# Patient Record
Sex: Female | Born: 1965 | Race: White | Hispanic: No | Marital: Single | Smoking: Never smoker
Health system: Southern US, Community
[De-identification: ages and names within clinical notes are randomized; demographics above are authoritative.]

## PROBLEM LIST (undated history)

## (undated) DIAGNOSIS — R7303 Prediabetes: Secondary | ICD-10-CM

## (undated) DIAGNOSIS — H00019 Hordeolum externum unspecified eye, unspecified eyelid: Secondary | ICD-10-CM

## (undated) DIAGNOSIS — I10 Essential (primary) hypertension: Secondary | ICD-10-CM

## (undated) DIAGNOSIS — B029 Zoster without complications: Secondary | ICD-10-CM

## (undated) DIAGNOSIS — E7800 Pure hypercholesterolemia, unspecified: Secondary | ICD-10-CM

## (undated) HISTORY — DX: Zoster without complications: B02.9

## (undated) HISTORY — PX: NO PAST SURGERIES: SHX2092

## (undated) HISTORY — DX: Hordeolum externum unspecified eye, unspecified eyelid: H00.019

---

## 1998-02-13 ENCOUNTER — Other Ambulatory Visit: Admission: RE | Admit: 1998-02-13 | Discharge: 1998-02-13 | Payer: Self-pay | Admitting: *Deleted

## 1999-02-16 ENCOUNTER — Other Ambulatory Visit: Admission: RE | Admit: 1999-02-16 | Discharge: 1999-02-16 | Payer: Self-pay | Admitting: *Deleted

## 1999-06-14 ENCOUNTER — Encounter (INDEPENDENT_AMBULATORY_CARE_PROVIDER_SITE_OTHER): Payer: Self-pay | Admitting: Specialist

## 1999-06-14 ENCOUNTER — Ambulatory Visit (HOSPITAL_COMMUNITY): Admission: AD | Admit: 1999-06-14 | Discharge: 1999-06-14 | Payer: Self-pay | Admitting: Obstetrics and Gynecology

## 1999-10-27 ENCOUNTER — Other Ambulatory Visit: Admission: RE | Admit: 1999-10-27 | Discharge: 1999-10-27 | Payer: Self-pay | Admitting: *Deleted

## 2000-04-20 ENCOUNTER — Inpatient Hospital Stay (HOSPITAL_COMMUNITY): Admission: AD | Admit: 2000-04-20 | Discharge: 2000-04-22 | Payer: Self-pay | Admitting: *Deleted

## 2000-04-22 ENCOUNTER — Encounter: Payer: Self-pay | Admitting: *Deleted

## 2000-05-03 ENCOUNTER — Inpatient Hospital Stay (HOSPITAL_COMMUNITY): Admission: AD | Admit: 2000-05-03 | Discharge: 2000-05-07 | Payer: Self-pay | Admitting: Obstetrics and Gynecology

## 2000-05-08 ENCOUNTER — Encounter: Admission: RE | Admit: 2000-05-08 | Discharge: 2000-08-06 | Payer: Self-pay | Admitting: Obstetrics and Gynecology

## 2000-06-21 ENCOUNTER — Other Ambulatory Visit: Admission: RE | Admit: 2000-06-21 | Discharge: 2000-06-21 | Payer: Self-pay | Admitting: *Deleted

## 2001-06-28 ENCOUNTER — Other Ambulatory Visit: Admission: RE | Admit: 2001-06-28 | Discharge: 2001-06-28 | Payer: Self-pay | Admitting: *Deleted

## 2002-10-26 ENCOUNTER — Other Ambulatory Visit: Admission: RE | Admit: 2002-10-26 | Discharge: 2002-10-26 | Payer: Self-pay | Admitting: Obstetrics and Gynecology

## 2004-01-29 ENCOUNTER — Other Ambulatory Visit: Admission: RE | Admit: 2004-01-29 | Discharge: 2004-01-29 | Payer: Self-pay | Admitting: Obstetrics and Gynecology

## 2009-01-01 ENCOUNTER — Encounter: Admission: RE | Admit: 2009-01-01 | Discharge: 2009-01-01 | Payer: Self-pay | Admitting: Internal Medicine

## 2009-07-23 LAB — HM MAMMOGRAPHY: HM Mammogram: NORMAL

## 2009-07-24 LAB — CONVERTED CEMR LAB: Pap Smear: NORMAL

## 2009-10-24 IMAGING — CR DG FOOT COMPLETE 3+V*L*
3 series · 3 of 3 positions shown · non-contrast
Comparison: None.

CLINICAL DATA: Injury 1 week ago.

LEFT FOOT - COMPLETE 3+ VIEW

[view not recorded (1 of 3)]
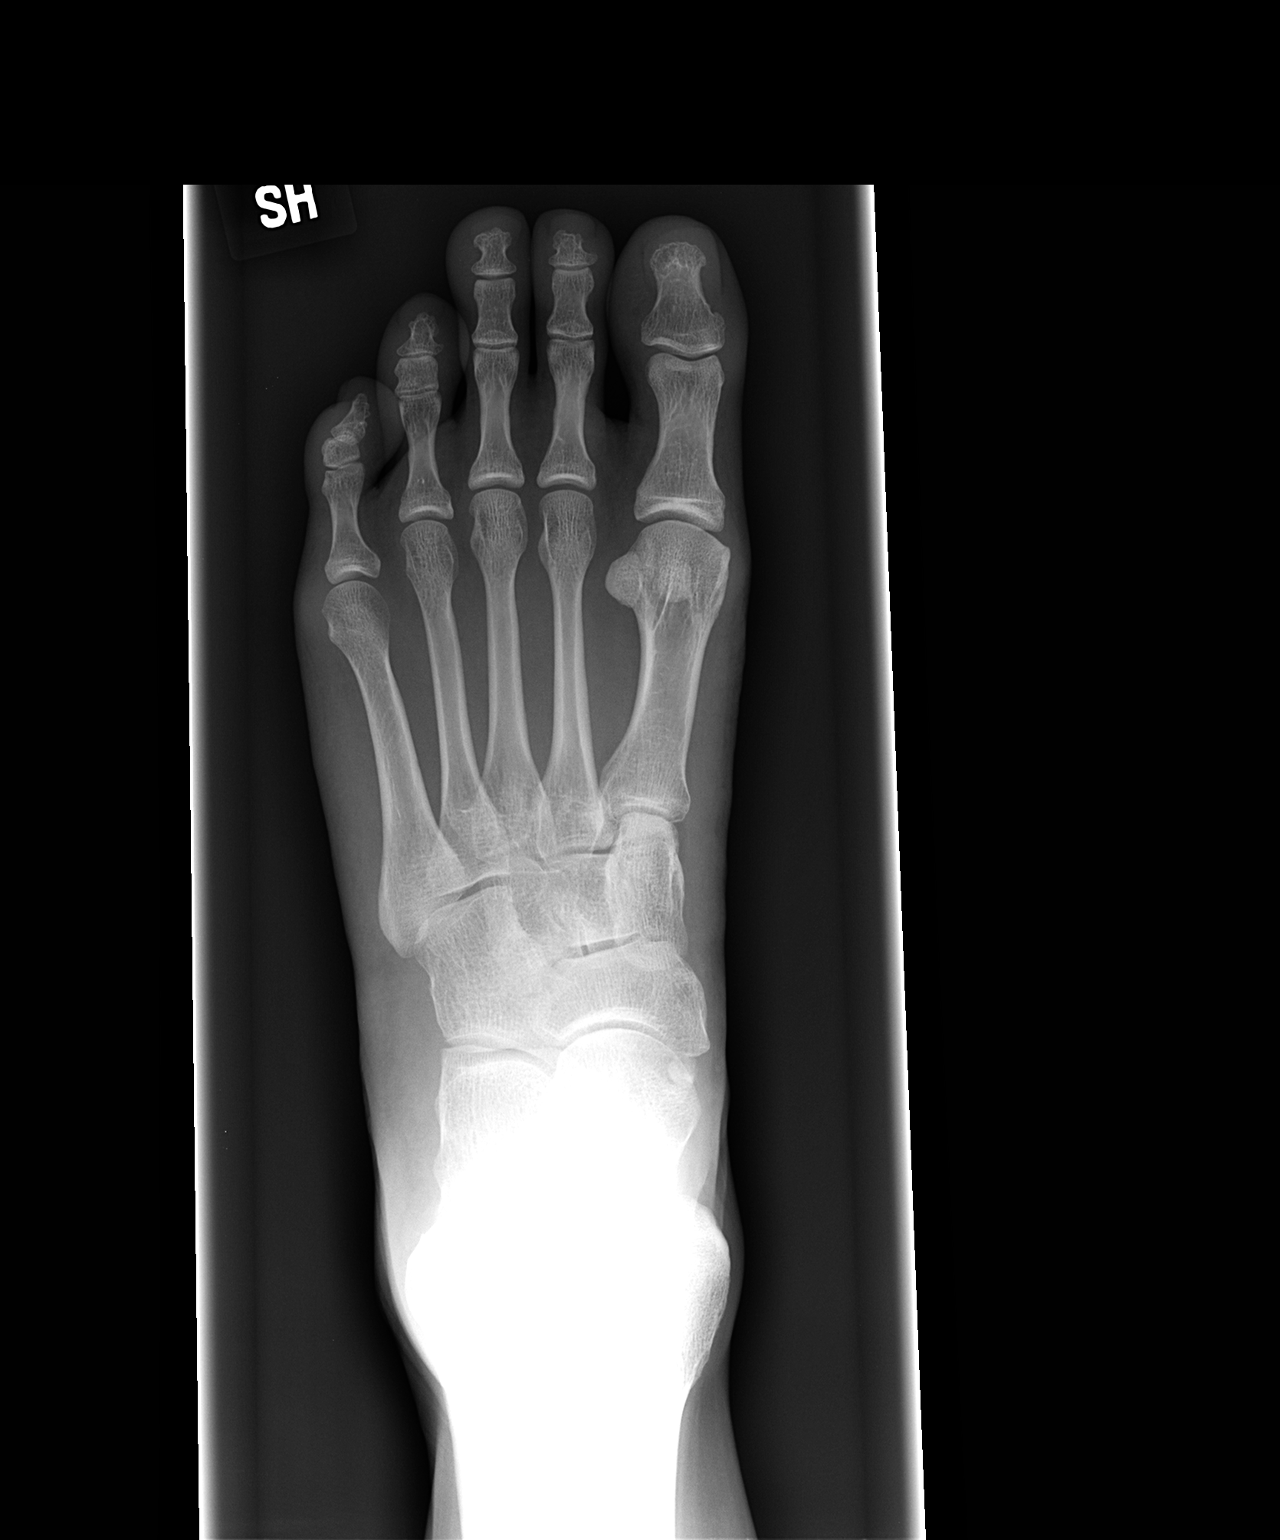

[view not recorded (2 of 3)]
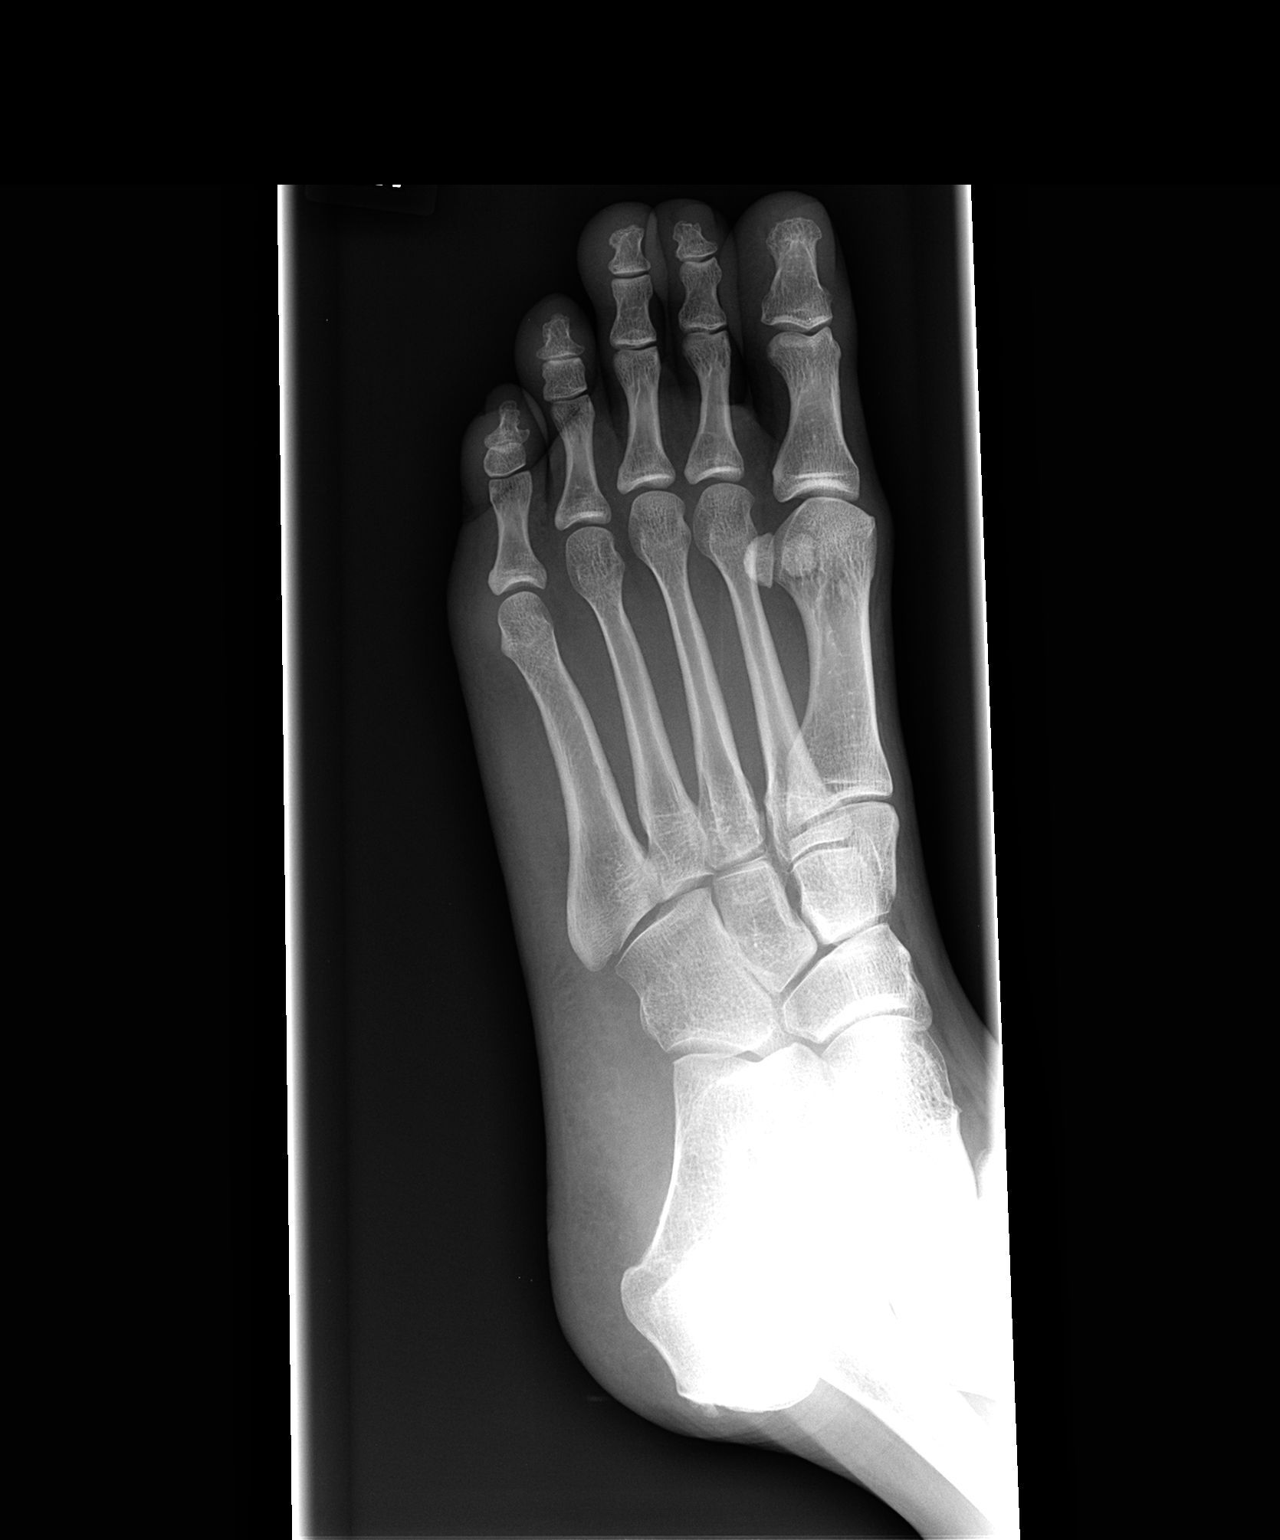

[view not recorded (3 of 3)]
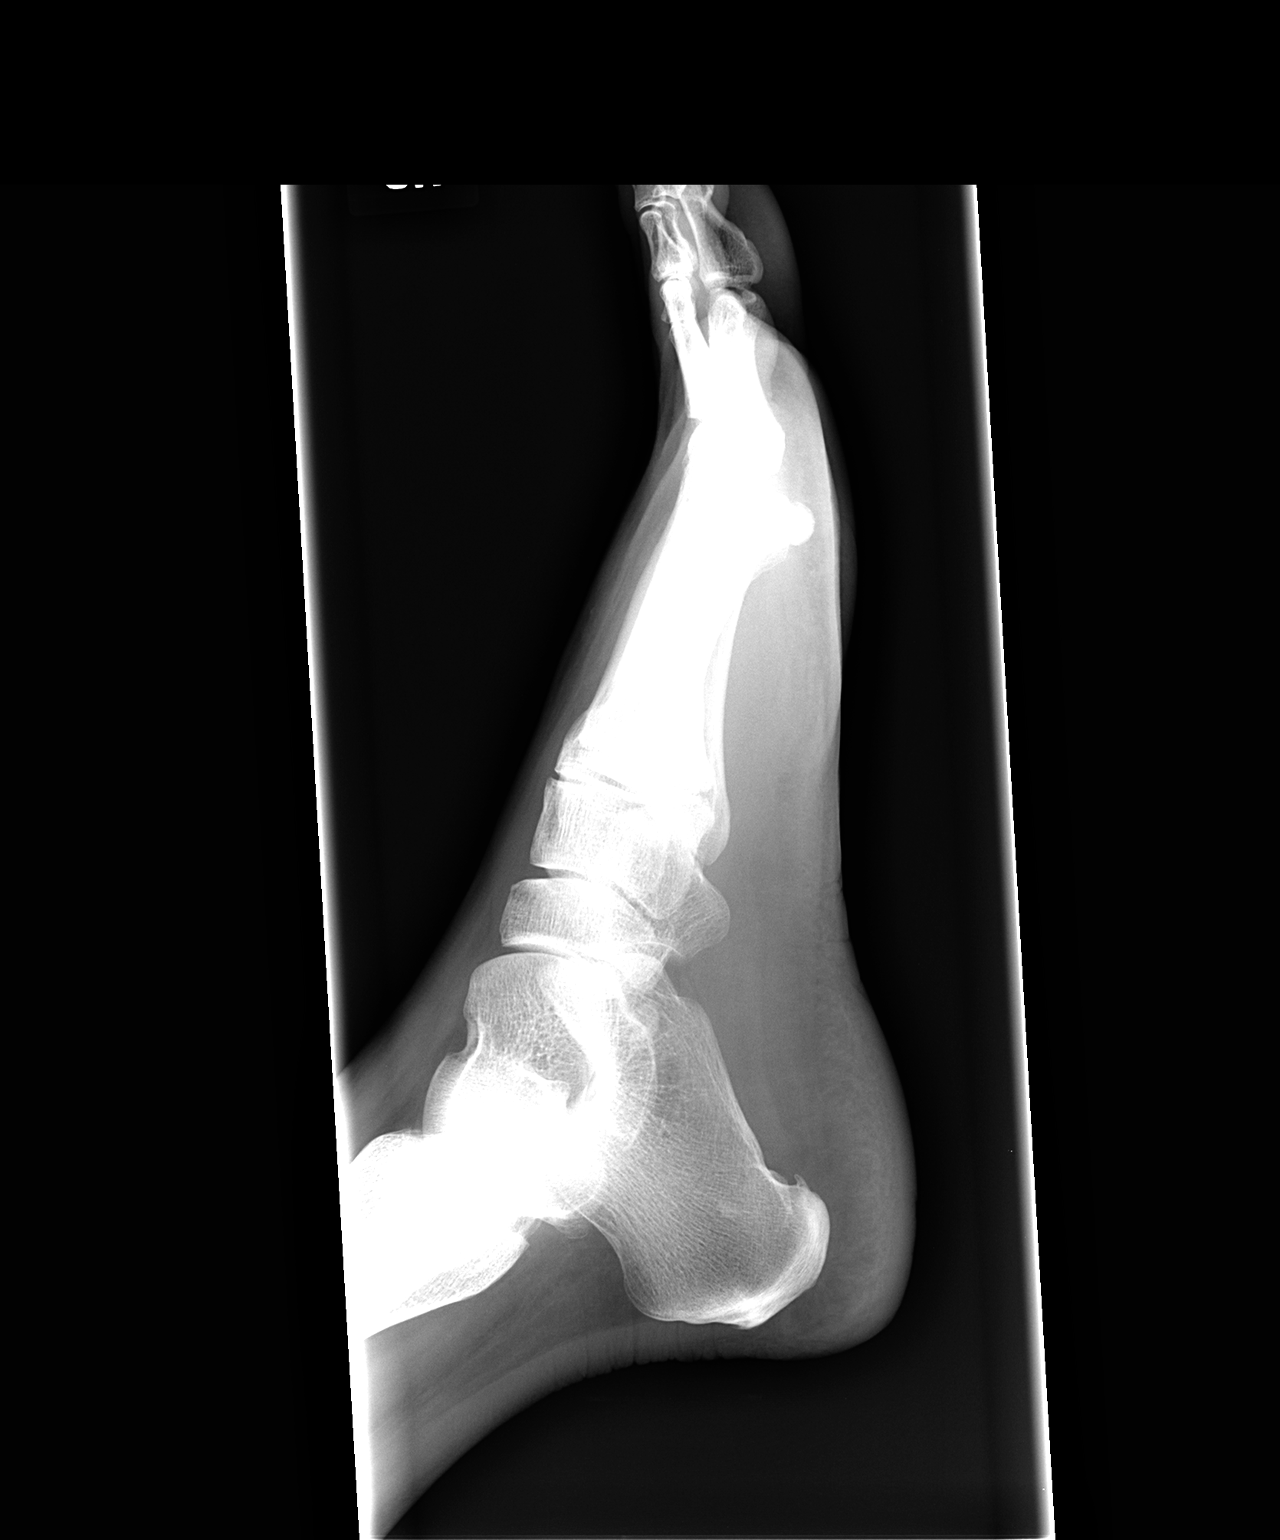

[3 of 3 positions shown; findings below may reference images not displayed]

FINDINGS: Negative for fracture.  There is no arthropathy and the
alignment is normal.  There is early spurring of the calcaneus.
IMPRESSION: Negative for fracture.

## 2009-10-24 IMAGING — CR DG ANKLE COMPLETE 3+V*L*
3 series · 3 of 3 positions shown · non-contrast
Comparison: None

CLINICAL DATA: Rolled foot a week ago with some pain laterally

LEFT ANKLE COMPLETE - 3+ VIEW

[view not recorded (1 of 3)]
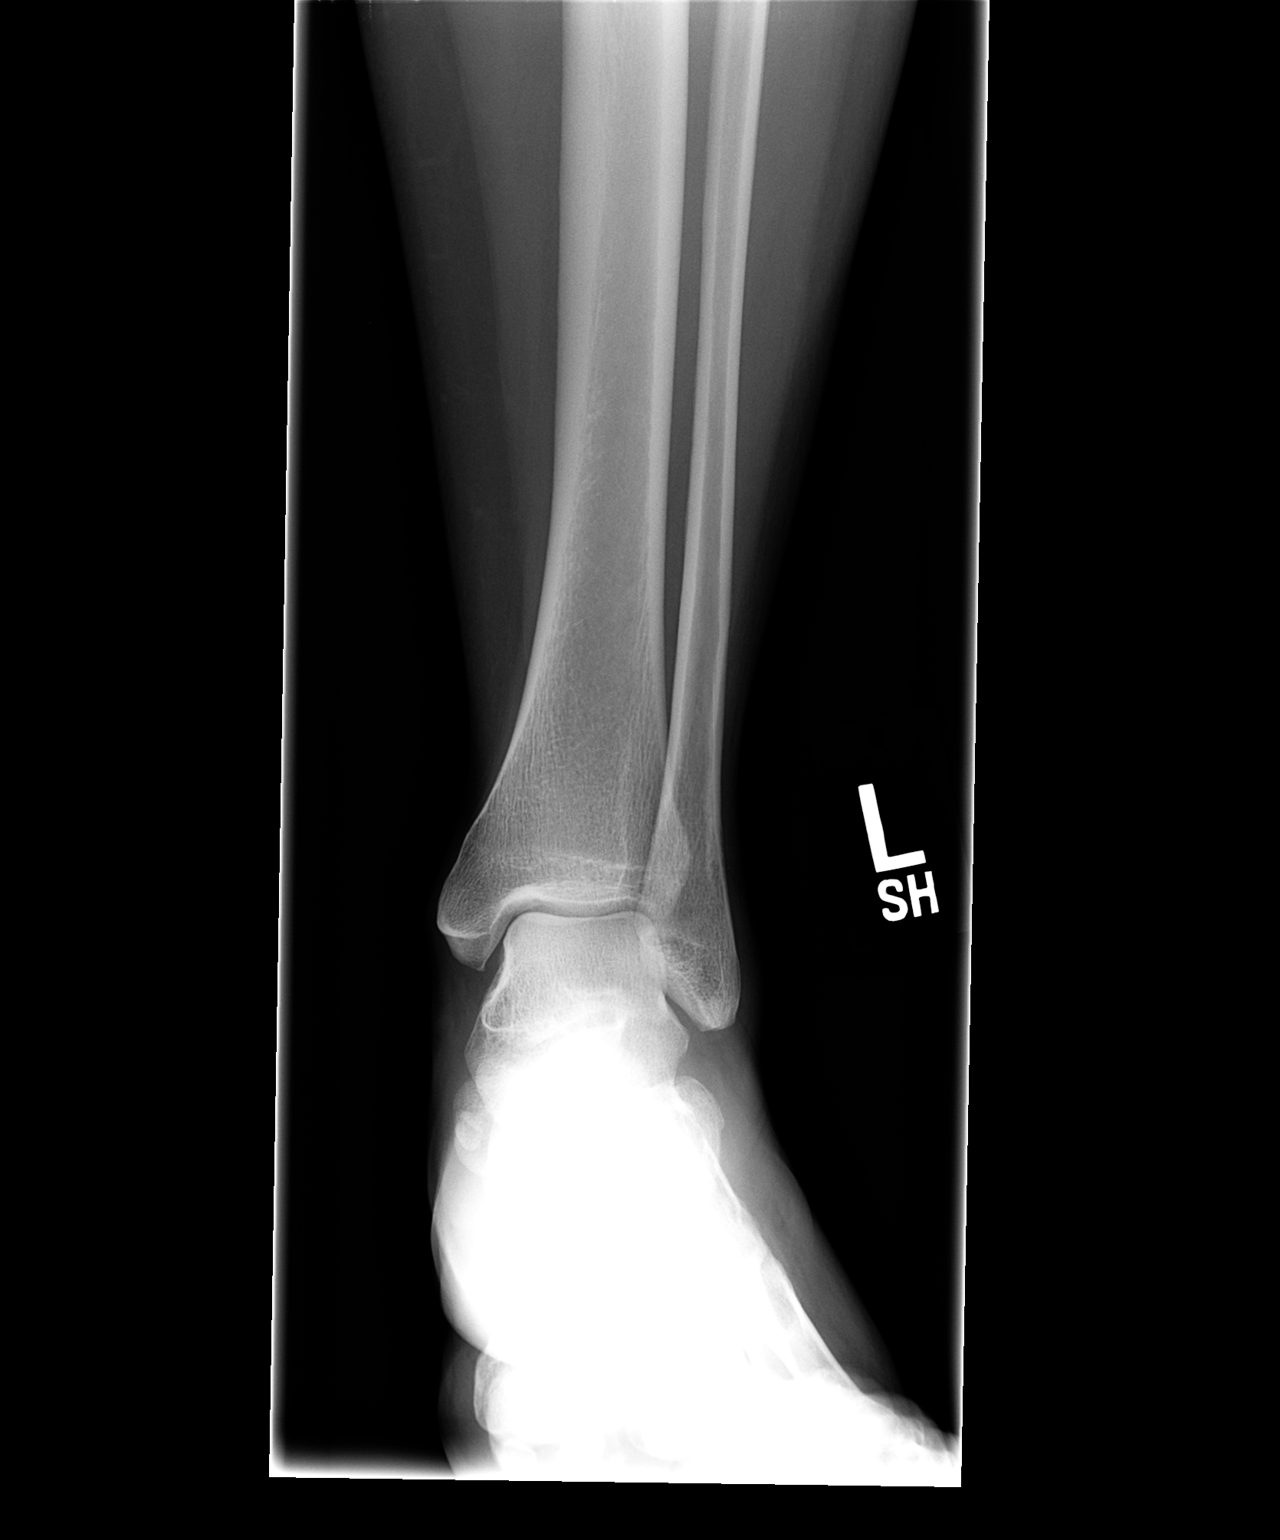

[view not recorded (2 of 3)]
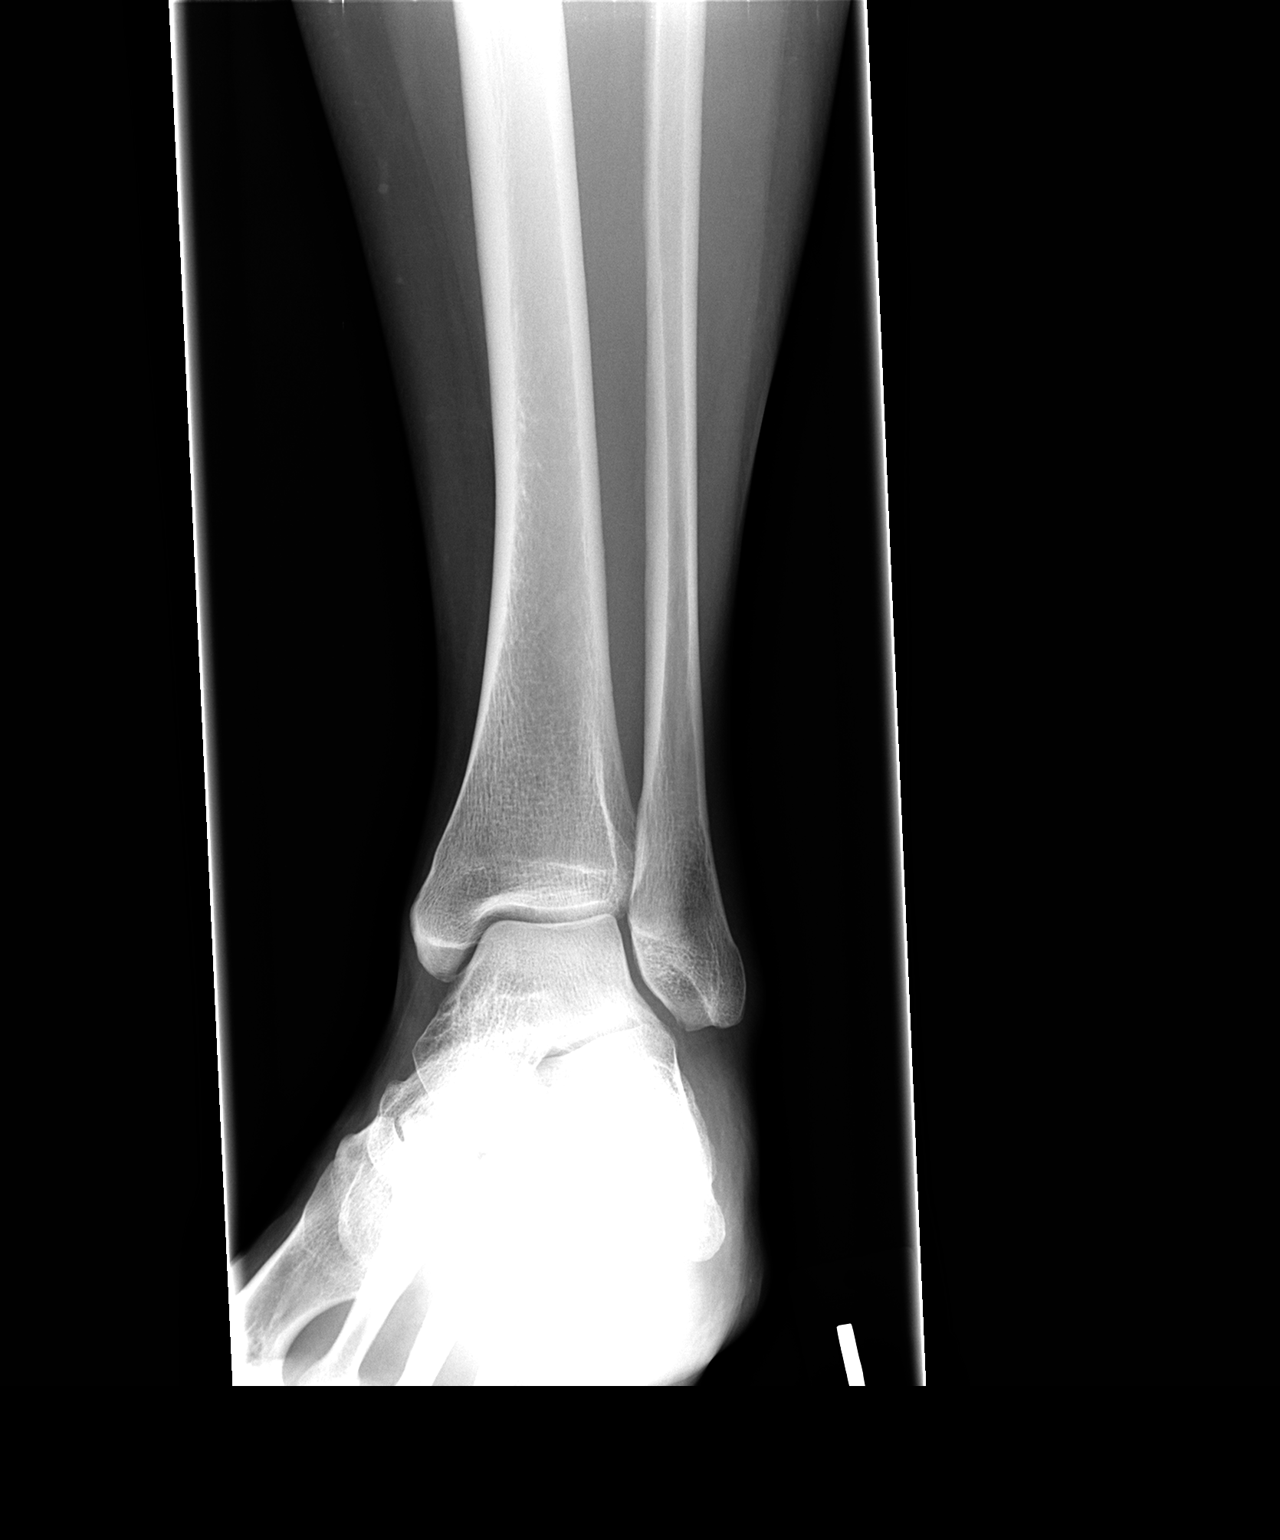

[view not recorded (3 of 3)]
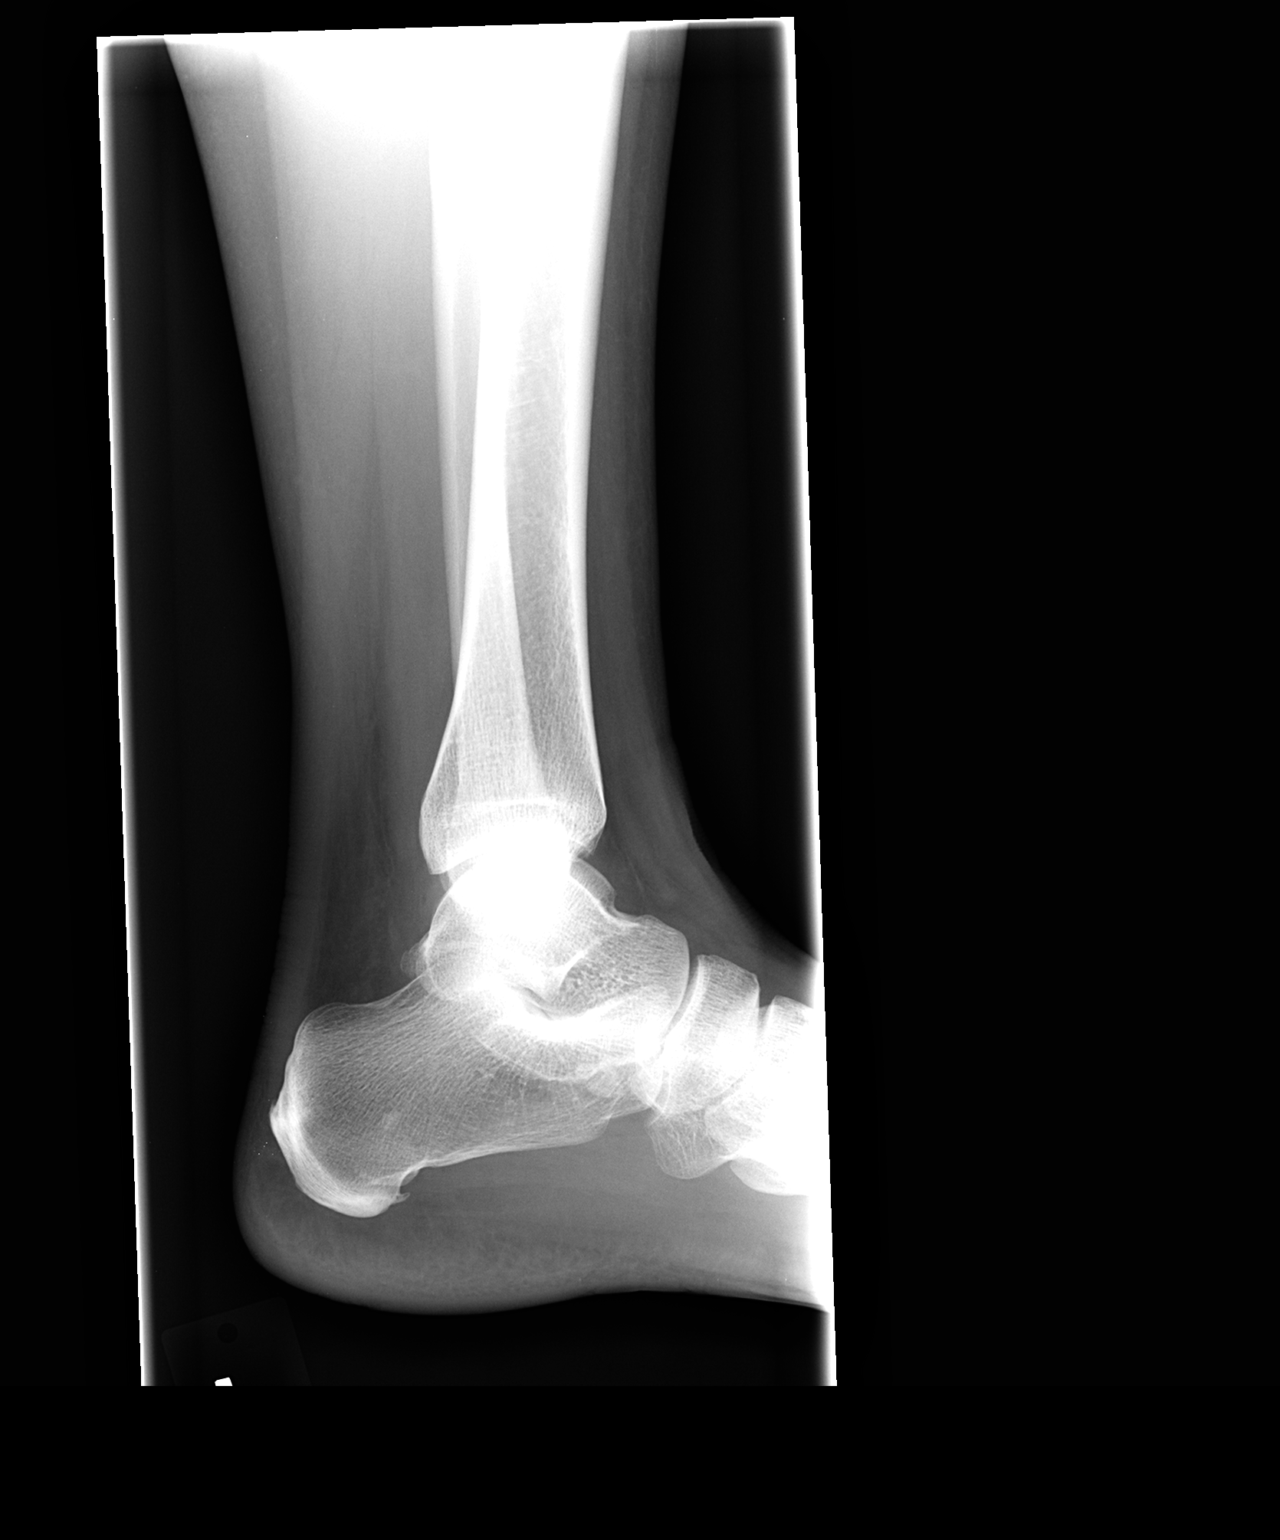

[3 of 3 positions shown; findings below may reference images not displayed]

FINDINGS: No acute fracture is seen.  The ankle joint appears
normal.  Alignment is normal.  A small plantar calcaneal
degenerative spur is noted.
IMPRESSION: No acute abnormality.

## 2010-01-13 ENCOUNTER — Ambulatory Visit: Payer: Self-pay | Admitting: Family Medicine

## 2010-01-13 DIAGNOSIS — K5289 Other specified noninfective gastroenteritis and colitis: Secondary | ICD-10-CM | POA: Insufficient documentation

## 2010-01-13 DIAGNOSIS — B029 Zoster without complications: Secondary | ICD-10-CM | POA: Insufficient documentation

## 2010-02-03 ENCOUNTER — Ambulatory Visit: Payer: Self-pay | Admitting: Family Medicine

## 2010-02-04 LAB — CONVERTED CEMR LAB
ALT: 75 units/L — ABNORMAL HIGH (ref 0–35)
AST: 53 units/L — ABNORMAL HIGH (ref 0–37)
Albumin: 4.1 g/dL (ref 3.5–5.2)
Alkaline Phosphatase: 66 units/L (ref 39–117)
BUN: 12 mg/dL (ref 6–23)
Bilirubin, Direct: 0.1 mg/dL (ref 0.0–0.3)
CO2: 31 meq/L (ref 19–32)
Calcium: 9.5 mg/dL (ref 8.4–10.5)
Chloride: 103 meq/L (ref 96–112)
Cholesterol: 216 mg/dL — ABNORMAL HIGH (ref 0–200)
Creatinine, Ser: 0.7 mg/dL (ref 0.4–1.2)
Direct LDL: 126.7 mg/dL
GFR calc non Af Amer: 96.24 mL/min (ref >60)
Glucose, Bld: 85 mg/dL (ref 70–99)
HDL: 73.8 mg/dL (ref >39.00)
Potassium: 4.6 meq/L (ref 3.5–5.1)
Sodium: 142 meq/L (ref 135–145)
TSH: 1.96 microintl units/mL (ref 0.35–5.50)
Total Bilirubin: 0.6 mg/dL (ref 0.3–1.2)
Total CHOL/HDL Ratio: 3
Total Protein: 7 g/dL (ref 6.0–8.3)
Triglycerides: 46 mg/dL (ref 0.0–149.0)
VLDL: 9.2 mg/dL (ref 0.0–40.0)

## 2010-07-22 ENCOUNTER — Ambulatory Visit: Payer: Self-pay | Admitting: Family Medicine

## 2010-07-22 DIAGNOSIS — J019 Acute sinusitis, unspecified: Secondary | ICD-10-CM | POA: Insufficient documentation

## 2010-07-22 DIAGNOSIS — H00019 Hordeolum externum unspecified eye, unspecified eyelid: Secondary | ICD-10-CM

## 2010-11-10 NOTE — Assessment & Plan Note (Signed)
Summary: sinus infection/alc   Vital Signs:  Patient profile:   45 year old female Height:      63.5 inches Weight:      158 pounds BMI:     27.65 Temp:     97.5 degrees F oral Pulse rate:   80 / minute Pulse rhythm:   regular BP sitting:   110 / 72  (left arm) Cuff size:   regular  Vitals Entered By: Linde Gillis CMA Duncan Dull) (July 22, 2010 9:44 AM) CC: ? pink eye, ? sinus infection   History of Present Illness: 45 yo with 2 week of worsening left sinus pressure, now also with ? pink eye that started yesterday on her left as well. No discharge from her eye.  Conjunctiva not red or irritated.  Has bump on lower lid. No blurring or photophobia. No fevers or chills.   Has tried a Neti pot and OTC decongestant with no relief of symptoms.  Has PCN allergy  Acne- has had adult acne most of her adult life.  Metrogel has worked in past, out of rx.  Would like refilled.    Current Medications (verified): 1)  Azithromycin 250 Mg  Tabs (Azithromycin) .... 2 By  Mouth Today and Then 1 Daily For 4 Days 2)  Metrogel 1 % Gel (Metronidazole) .... Apply To Area Daily 3)  Erythromycin 5 Mg/gm Oint (Erythromycin) .... Apply To Eye Two Times A Day - Four Times Daily X 7 Days  Allergies: 1)  ! Penicillin  Review of Systems      See HPI General:  Denies fever. Eyes:  Denies blurring, discharge, eye pain, light sensitivity, and red eye. Resp:  Denies cough, shortness of breath, sputum productive, and wheezing.  Physical Exam  General:  Well-developed,well-nourished,in no acute distress; alert,appropriate and cooperative throughout examination Eyes:  left eye- PERRL, no conjunctival injection, small stye on lower lid. Ears:  R ear normal and L ear normal.   Nose:  right nostril- mild inflammation left nostril- boggy turbinates, very inflammed, non obstructed. left frontal and maxillary sinus TTP Mouth:  MMM Lungs:  Normal respiratory effort, chest expands symmetrically. Lungs are  clear to auscultation, no crackles or wheezes. Heart:  Normal rate and regular rhythm. S1 and S2 normal without gallop, murmur, click, rub or other extra sounds. Skin:  acneiform lesions.   Psych:  Cognition and judgment appear intact. Alert and cooperative with normal attention span and concentration. No apparent delusions, illusions, hallucinations   Impression & Recommendations:  Problem # 1:  ACUTE SINUSITIS, UNSPECIFIED (ICD-461.9) Assessment New Given duration and progression of symptoms, will treat for bacterial sinusitis with Zpack.   Her updated medication list for this problem includes:    Azithromycin 250 Mg Tabs (Azithromycin) .Marland Kitchen... 2 by  mouth today and then 1 daily for 4 days  Problem # 2:  STYE (ICD-373.11) Assessment: New Advised warm compresses.  Will also give rx for erythromycin ointment if symptoms progress.  Discussed difference btw stye and chalazion, she will contact me if symtpoms progress.  Complete Medication List: 1)  Azithromycin 250 Mg Tabs (Azithromycin) .... 2 by  mouth today and then 1 daily for 4 days 2)  Metrogel 1 % Gel (Metronidazole) .... Apply to area daily 3)  Erythromycin 5 Mg/gm Oint (Erythromycin) .... Apply to eye two times a day - four times daily x 7 days Prescriptions: ERYTHROMYCIN 5 MG/GM OINT (ERYTHROMYCIN) apply to eye two times a day - four times daily x 7 days  #  1 x 0   Entered and Authorized by:   Ruthe Mannan MD   Signed by:   Ruthe Mannan MD on 07/22/2010   Method used:   Electronically to        [facility]* (retail)       [address]       [place], [place]  [address]       Ph: [phone]       Fax: [phone]   RxID:   [id number] METROGEL 1 % GEL (METRONIDAZOLE) Apply to area daily  #60 g x 0   Entered and Authorized by:   Ruthe Mannan MD   Signed by:   Ruthe Mannan MD on 07/22/2010   Method used:   Electronically to        [facility]* (retail)       [address]       [place], [place]  [address]       Ph:  [phone]       Fax: [phone]   RxID:   [id number] AZITHROMYCIN 250 MG  TABS (AZITHROMYCIN) 2 by  mouth today and then 1 daily for 4 days  #6 x 0   Entered and Authorized by:   Ruthe Mannan MD   Signed by:   Ruthe Mannan MD on 07/22/2010   Method used:   Electronically to        [facility]* (retail)       [address]       [place], [place]  [address]       Ph: [phone]       Fax: [phone]   RxID:   [id number]   Current Allergies (reviewed today): ! PENICILLIN

## 2010-11-10 NOTE — Assessment & Plan Note (Signed)
Summary: Rash/CLE   Vital Signs:  Patient profile:   45 year old female Height:      63.5 inches Weight:      148.25 pounds BMI:     25.94 Temp:     97.9 degrees F oral Pulse rate:   80 / minute Pulse rhythm:   regular BP sitting:   126 / 90  (left arm) Cuff size:   regular  Vitals Entered By: Delilah Shan CMA Duncan Dull) (January 13, 2010 10:52 AM) CC: Rash   History of Present Illness: 45 yo female new to me who has not been seen here in 6 years here with rash.  [place]. On way home, got a GI bug, nausea and vomiting. That resolved but then had left sided pain, underbreast and left side, very sensitive to touch. She thought it was from vomiting. Then 2 days ago, rash appeared at same sight, very painful to touch and itchy. No fevers, chills, nausea or vomiting.  Well woman- sees Dr. Cherly Hensen yearly for pap and mammogram.  No h/o abnormalities. Has not had lipid panel in years.  Current Medications (verified): 1)  Valtrex 1 Gm Tabs (Valacyclovir Hcl) .Marland Kitchen.. 1 Tab Three Times A Day X 7 Days 2)  Promethazine Hcl 12.5 Mg Tabs (Promethazine Hcl) .Marland Kitchen.. 1 Tab Every 6 Hours As Needed Nausea  Allergies (verified): 1)  ! Penicillin  Review of Systems      See HPI General:  Denies chills, fatigue, and fever. ENT:  Denies difficulty swallowing. CV:  Denies chest pain or discomfort. Resp:  Denies cough. GI:  Denies abdominal pain, diarrhea, nausea, and vomiting. Derm:  Complains of rash. Psych:  Denies anxiety and depression.  Physical Exam  General:  Well-developed,well-nourished,in no acute distress; alert,appropriate and cooperative throughout examination Eyes:  No corneal or conjunctival inflammation noted. EOMI. Perrla. Funduscopic exam benign, without hemorrhages, exudates or papilledema. Vision grossly normal. Ears:  R ear normal and L ear normal.   Mouth:  MMM Lungs:  Normal respiratory effort, chest expands symmetrically. Lungs are clear to  auscultation, no crackles or wheezes. Heart:  Normal rate and regular rhythm. S1 and S2 normal without gallop, murmur, click, rub or other extra sounds. Abdomen:  Bowel sounds positive,abdomen soft and non-tender without masses, organomegaly or hernias noted. Skin:  erythematous, vesicular rash under left breast and rib, consistent with zoseter Psych:  Cognition and judgment appear intact. Alert and cooperative with normal attention span and concentration. No apparent delusions, illusions, hallucinations   Impression & Recommendations:  Problem # 1:  SHINGLES (ICD-053.9) Assessment New Within window for treatment with Valtrex.  Problem # 2:  GASTROENTERITIS WITHOUT DEHYDRATION (ICD-558.9) Assessment: Improved Much improved, will give phenergan to have as needed.  Problem # 3:  Preventive Health Care (ICD-V70.0) Assessment: Comment Only shedule fasting lab appt (see pt instructions for details).  Complete Medication List: 1)  Valtrex 1 Gm Tabs (Valacyclovir hcl) .Marland Kitchen.. 1 tab three times a day x 7 days 2)  Promethazine Hcl 12.5 Mg Tabs (Promethazine hcl) .Marland Kitchen.. 1 tab every 6 hours as needed nausea  Patient Instructions: 1)  Great to meet you, [name]. 2)  I have sent Valtrex and phenergan into your pharmacy. 3)  Please schedule a fasting lab appointment in a couple of weeks to check your lipid panel, hepatic panel (V77.91), TSH, BMET (V70.0) Prescriptions: PROMETHAZINE HCL 12.5 MG TABS (PROMETHAZINE HCL) 1 tab every 6 hours as needed nausea  #20 x 0  Entered and Authorized by:   Ruthe Mannan MD   Signed by:   Ruthe Mannan MD on 01/13/2010   Method used:   Electronically to        [facility]* (retail)       [address]       [place], [place]  [address]       Ph: [phone]       Fax: [phone]   RxID:   [id number] VALTREX 1 GM TABS (VALACYCLOVIR HCL) 1 tab three times a day x 7 days  #21 x 0   Entered and Authorized by:   Ruthe Mannan MD   Signed by:   Ruthe Mannan MD on  01/13/2010   Method used:   Electronically to        [facility]* (retail)       [address]       [place], [place]  [address]       Ph: [phone]       Fax: [phone]   RxID:   [id number]   Prior Medications (reviewed today): None Current Allergies (reviewed today): ! PENICILLIN  PAP Result Date:  07/24/2009 PAP Result:  normal PAP Next Due:  2 yr Mammogram Result Date:  07/23/2009 Mammogram Result:  normal Mammogram Next Due:  1 yr

## 2011-02-26 NOTE — H&P (Signed)
[facility]  Patient:    Angel Haney, Angel Haney                     MRN: [id number] Adm. Date:  [id number] Disc. Date: [id number] Attending:  Maxie Better                         History and Physical  REASON FOR ADMISSION:             Intrauterine pregnancy at 37 weeks with severe oligohydramnios.  HISTORY OF PRESENT ILLNESS:       This is a 45 year old, married, white female, gravida 4, para 1, abortus 2, with an estimated delivery date of May 25, 2000, who presented in the office today for followup ultrasound for previous history of oligohydramnios for which she was hospitalized at 35 weeks for strict bed rest and IV hydration.  On todays ultrasound, amniotic fluid index is 4.3, which is first percentile for 37 weeks.  She reports good fetal activity, denies any contractions, denies any symptoms of pregnancy-induced hypertension, and also denies any bleeding or leaking of water.                                    She is admitted tonight to undergo elective cervical ripening to continue with induction of labor due to severe oligohydramnios.  PRENATAL LABORATORY DATA:         Blood type A positive, RPR nonreactive, rubella immune, HBsAg negative.  HIV nonreactive in August 2000.  Pap smear normal in January 2001.  Gonorrhea and chlamydia negative.  Ultrasound confirmed estimated delivery date.  A 16-week amniocentesis revealed a normal female karyotype and normal amniotic fluid AFP.  A 20-week ultrasound reveals a normal anatomy survey with posterior low lying placenta.  Cervical length slightly shortened to 2.5 cm.  A 24-week ultrasound reveals cervical measurement at 1.9 cm.  A 28-week glucose tolerance test was within normal limits.  A 35-week group B strep is negative.  A 35-week ultrasound revealed an amniotic fluid index of 2.9 cm or first percentile with normal growth at 53rd percentile.  Repeat ultrasound five days later revealed a normal  amniotic fluid index at 8.8 or 12th percentile.  ALLERGIES:                        No allergy to penicillin.  PAST MEDICAL HISTORY:             In 1985, termination of pregnancy at six weeks without complication.  March 1998, spontaneous vaginal delivery at 41 weeks of a female infant weighing 9 pounds 14 ounces with shoulder dystocia. September 2000, missed abortion with need for D&C with no complications.  FAMILY HISTORY:                   Father with chronic hypertension.  Maternal grandmother with breast cancer.  SOCIAL HISTORY:                   Married, nonsmoker, homemaker.  PHYSICAL EXAMINATION:  VITAL SIGNS:                      Blood pressure 112/70, current weight 171-1/2 pounds for total weight gain during this pregnancy of 32 pounds.  HEENT:  Negative.  LUNGS:                            Clear.  HEART:                            Normal.  ABDOMEN:                          Gravid, nontender with appropriate fundal height, vertex presentation.  PELVIC:                           Vaginal exam is 1 cm, 75% effaced, soft, vertex, -1 to 0.  EXTREMITIES:                      Negative.  ASSESSMENT:                       Intrauterine pregnancy at 37 weeks with recurrent severe oligohydramnios.  PLAN:                             After discussion with Dr. Seymour Bars and Dr. Cherly Hensen, agreement was achieved to proceed with cervical ripening tonight with Cervidil and continue with Pitocin augmentation tomorrow morning.  The patient is informed of that decision and is aware of this lightly increased risk of cesarean section for fetal distress but is also made aware of the possibility of amnioinfusion if needed.  Voiced understanding. DD:  05/03/00 TD:  05/03/00 Job: [id number]

## 2011-02-26 NOTE — H&P (Signed)
[facility]  Patient:    Angel Haney, Angel Haney                     MRN: [id number] Adm. Date:  [id number] Attending:  Maxie Better CC:         Sung Amabile. Roslyn Smiling, M.D.  (Office)                         History and Physical  CHIEF COMPLAINT:              Severe oligohydramnios at [redacted] weeks gestational age.  HISTORY OF PRESENT ILLNESS:   A 45 year old woman, G4, P1, 0, 2, 1, who underwent pregnancy ultrasound at the [facility] on July 11th to evaluate a drop in the fundal height.  Amniotic fluid index was 2.9 cm (in the first percentile).  Estimated fetal weight was in the 53rd percentile (2,649 grams).  Uterine artery Doppler studies were normal.  Biophysical profile was 6/8 with 0 for fluid.  The patient reports good fetal activity.  She has had no vaginal leakage and ruptured membranes was ruled out at the office on July 11th.  She is admitted now for aggressive hydration and strict bedrest.  Antenatal course has been remarkable for first trimester spotting and subchorionic hemorrhage noted, which resolved thereafter.   Dynamic cervical change noted on ultrasound at [redacted] weeks gestational age.  The patient has maintained reasonable bedrest since that time.  Advanced maternal age. Amniocentesis revealed normal chromosomes.  PAST MEDICAL HISTORY:         Medical:  None.  Surgical:  D&C September 2000.  ALLERGIES:                    PENICILLIN causes tongue swelling.  MEDICATIONS:                  Prenatal vitamins.  FAMILY HISTORY:               Maternal grandmother with breast carcinoma. Father with hypertension.  SOCIAL HISTORY:               Married.  Homemaker.  Denies tobacco or ethanol use.  PHYSICAL EXAMINATION:  VITAL SIGNS:                  Afebrile.  Blood pressure 104/64.  Fetal heart rate 144.  HEENT:                        Within normal limits.  NECK:                         Without thyromegaly.  CHEST:                         Clear.  COR:                          Regular rate and rhythm.  S1 and S2 normal.  ABDOMEN:                      Soft.  Nontender.  Fundal height 32 cm.  GU:                           Cervix closed, 2 cm, long, -2  and vertex.  EXTREMITIES:                  Without CCE.  NEUROLOGIC:                   Grossly intact.  ANTENATAL LABS:               A positive.  RPR nonreactive. Rubella immune. Hepatitis B surface antigen negative.  HIV nonreactive.  Cervical culture is negative.  Pap within normal limits.  Amniotic fluid - chromosomes 46 xx with normal AFP.  One hour Glucola 132 mg%.  Group B Strep pending.  ASSESSMENT:                   1. Intrauterine pregnancy at [redacted] weeks                                  gestational age.                               2. Severe oligohydramnios - no evidence of                                  ruptured membranes.  No evidence of                                  hypertension.                               3. Preterm cervical change - stable over                                  the past few weeks.                               4. Advanced maternal age - normal chromosomes.  PLAN:                         1. Admit for monitoring and aggressive                                  hydration with strict bed rest.                               2. Reevaluate by ultrasound for fluid on                                  July 13th. DD:  04/20/00 TD:  04/21/00 Job: [id number]

## 2011-02-26 NOTE — Discharge Summary (Signed)
[facility]  Patient:    [name], Angel Haney                     MRN: [id number] Adm. Date:  [id number] Disc. Date: [id number] Attending:  Maxie Better                           Discharge Summary  HOSPITAL COURSE:              The patient underwent uncomplicated induction for oligohydramnios, progressed for delivery of a viable female over small central episiotomy on May 04, 2000.  Postoperatively, it was noted on postpartum day #1 that the patients bleeding was slightly increased.  She was examined on postpartum day #2.  At that time, it was noted that her perineum now had a large vulvovaginal hematoma.  This was evaluated on May 06, 2000, by Dr. Seymour Bars.  Decision was made to watch the patients hemoglobin closely and consider surgical drainage.  Her hemoglobin remained stable.  The line of demarcation of her hematoma was not changed.  Her hemoglobin stabilized at 7.4.  She was discharged to home on postpartum day #3.  FOLLOWUP:                     She was to follow up in the office within one week.  She was to call for any increased bleeding, pain, or discomfort.  DISCHARGE MEDICATIONS:        Prenatal vitamins and iron. DD:  05/27/00 TD:  05/30/00 Job: [id number]

## 2011-02-26 NOTE — Discharge Summary (Signed)
[facility]  Patient:    Angel Haney, Angel Haney                     MRN: [id number] Adm. Date:  [id number] Disc. Date: [id number] Attending:  Maxie Better                           Discharge Summary  DISCHARGE DIAGNOSES:          1. Intrauterine pregnancy at [redacted] weeks                                  gestational age.                               2. Severe oligohydramnios.                               3. Discharged undelivered.  HISTORY OF PRESENT ILLNESS:   A 45 year old woman, G4, P1-0-2-1, admitted for management of severe oligohydramnios which was diagnosed in the office at [redacted] weeks gestational age.  Amniotic fluid index was 2.9 which is in the first percentile.  There was no intrauterine growth restriction.  Uterine artery Dopplers were normal.  HOSPITAL COURSE:              The patient was admitted to [facility] for strict bed rest and IV hydration.  Repeat ultrasound revealed and AFI of 8.1%.  Reassuring fetal tracing was obtained while the patient was in the hospital. She was discharged to home with instructions for bed rest.  FOLLOWUP:                     Will be within a week at [facility].  She was given labor precautions. DD:  06/10/00 TD:  06/11/00 Job: [id number]

## 2011-09-28 ENCOUNTER — Encounter: Payer: Self-pay | Admitting: Family Medicine

## 2011-09-28 ENCOUNTER — Ambulatory Visit (INDEPENDENT_AMBULATORY_CARE_PROVIDER_SITE_OTHER): Payer: Commercial Managed Care - PPO | Admitting: Family Medicine

## 2011-09-28 VITALS — BP 136/90 | HR 100 | Temp 98.2°F | Wt 155.2 lb

## 2011-09-28 DIAGNOSIS — J019 Acute sinusitis, unspecified: Secondary | ICD-10-CM

## 2011-09-28 MED ORDER — DOXYCYCLINE HYCLATE 100 MG PO CAPS: 100.0000 mg | ORAL_CAPSULE | Freq: Two times a day (BID) | ORAL | Status: AC

## 2011-09-28 NOTE — Assessment & Plan Note (Signed)
Anticipate viral as only 6-7 days. If not better, fill doxy. For now start mucinex with plenty of fluid, supportive care as per instructions.

## 2011-09-28 NOTE — Progress Notes (Signed)
  Subjective:    Patient ID: Angel Haney, female    DOB: 1966/06/26, 45 y.o.   MRN: [id number]  HPI CC: sinusitis?  1 wk h/o sinus drainage with PNdrainage, worse on left side of sinuses.  Last night started with body aches.  + scratchy throat.  Started with chills last night.  Slight nausea.    Tried nyquil, benadryl, allegra D, and zyrtec.  No fevers, ST, cough, abd pain, ear pain or tooth pain, chest pain, SOB.  No sick contacts at home.  No smokers at home.  No h/o asthma/COPD.  Review of Systems Per HPI    Objective:   Physical Exam  Nursing note and vitals reviewed. Constitutional: She appears well-developed and well-nourished. No distress.  HENT:  Head: Normocephalic and atraumatic.  Right Ear: Hearing, tympanic membrane, external ear and ear canal normal.  Left Ear: Hearing, tympanic membrane, external ear and ear canal normal.  Nose: No mucosal edema or rhinorrhea. Right sinus exhibits no maxillary sinus tenderness and no frontal sinus tenderness. Left sinus exhibits maxillary sinus tenderness. Left sinus exhibits no frontal sinus tenderness.  Mouth/Throat: Uvula is midline, oropharynx is clear and moist and mucous membranes are normal. No oropharyngeal exudate, posterior oropharyngeal edema, posterior oropharyngeal erythema or tonsillar abscesses.       + PNDrainage  Eyes: Conjunctivae and EOM are normal. Pupils are equal, round, and reactive to light. No scleral icterus.  Neck: Normal range of motion. Neck supple.  Cardiovascular: Normal rate, regular rhythm, normal heart sounds and intact distal pulses.   No murmur heard. Pulmonary/Chest: Effort normal and breath sounds normal. No respiratory distress. She has no wheezes. She has no rales.  Lymphadenopathy:    She has no cervical adenopathy.  Skin: Skin is warm and dry. No rash noted.       Assessment & Plan:

## 2011-09-28 NOTE — Patient Instructions (Signed)
You have a sinus infection. As early on may just be viral, give a few more days for body to fight this. If any worsening, fever >101, or not improving as expected, fill doxycycline twice daily for 10 days. Push fluids and plenty of rest. Nasal saline irrigation or neti pot to help drain sinuses. May use simple mucinex with plenty of fluid to help mobilize mucous. Let us know if fever >101.5, trouble opening/closing mouth, difficulty swallowing, or worsening - you may need to be seen again.

## 2020-01-17 ENCOUNTER — Ambulatory Visit: Payer: Commercial Managed Care - PPO | Attending: Internal Medicine

## 2020-01-17 DIAGNOSIS — Z23 Encounter for immunization: Secondary | ICD-10-CM

## 2020-01-17 NOTE — Progress Notes (Signed)
   Covid-19 Vaccination Clinic  Name:  Angel Haney    MRN: [id number] DOB: 10-Dec-1965  01/17/2020  [name] was observed post Covid-19 immunization for 30 minutes based on pre-vaccination screening without incident. She was provided with Vaccine Information Sheet and instruction to access the V-Safe system.   [name] was instructed to call 911 with any severe reactions post vaccine: Marland Kitchen Difficulty breathing  . Swelling of face and throat  . A fast heartbeat  . A bad rash all over body  . Dizziness and weakness   Immunizations Administered    Name Date Dose VIS Date Route   Pfizer COVID-19 Vaccine 01/17/2020  4:03 PM 0.3 mL 09/21/2019 Intramuscular   Manufacturer: [employer], [employer]   Lot: [id number]   NDC: [address]-1

## 2020-02-11 ENCOUNTER — Ambulatory Visit: Payer: Commercial Managed Care - PPO | Attending: Internal Medicine

## 2020-02-11 DIAGNOSIS — Z23 Encounter for immunization: Secondary | ICD-10-CM

## 2020-02-11 NOTE — Progress Notes (Signed)
   Covid-19 Vaccination Clinic  Name:  Angel Haney    MRN: [id number] DOB: 08-May-1966  02/11/2020  [name] was observed post Covid-19 immunization for 30 minutes based on pre-vaccination screening without incident. She was provided with Vaccine Information Sheet and instruction to access the V-Safe system.   [name] was instructed to call 911 with any severe reactions post vaccine: Marland Kitchen Difficulty breathing  . Swelling of face and throat  . A fast heartbeat  . A bad rash all over body  . Dizziness and weakness   Immunizations Administered    Name Date Dose VIS Date Route   Pfizer COVID-19 Vaccine 02/11/2020 10:18 AM 0.3 mL 12/05/2018 Intramuscular   Manufacturer: [employer], [employer]   Lot: [id number]   NDC: [address]-9

## 2022-11-18 ENCOUNTER — Ambulatory Visit
Admission: EM | Admit: 2022-11-18 | Discharge: 2022-11-18 | Disposition: A | Discharge status: Home / Self Care | Payer: Commercial Managed Care - PPO | Attending: Urgent Care | Admitting: Urgent Care

## 2022-11-18 DIAGNOSIS — N3001 Acute cystitis with hematuria: Secondary | ICD-10-CM | POA: Diagnosis not present

## 2022-11-18 LAB — POCT URINALYSIS DIP (MANUAL ENTRY)
Bilirubin, UA: NEGATIVE
Glucose, UA: NEGATIVE mg/dL
Ketones, POC UA: NEGATIVE mg/dL
Nitrite, UA: POSITIVE — AB
Protein Ur, POC: >=300 mg/dL — AB
Spec Grav, UA: >=1.03 — AB (ref 1.010–1.025)
Urobilinogen, UA: 0.2 E.U./dL
pH, UA: 6 (ref 5.0–8.0)

## 2022-11-18 MED ORDER — NITROFURANTOIN MONOHYD MACRO 100 MG PO CAPS: 100.0000 mg | ORAL_CAPSULE | Freq: Two times a day (BID) | ORAL | 0 refills | Status: DC

## 2022-11-18 NOTE — ED Triage Notes (Signed)
Patient to Urgent Care with complaints of dysuria and urinary frequency. Reports some suprapubic pain/ pressure. Denies any vaginal discharge. No known fevers.  Symptoms started four days ago. Symptoms worsened yesterday.  Has been taking urostat w/cranberry to help with symptoms.

## 2022-11-18 NOTE — ED Provider Notes (Signed)
[facility]    CSN: [id number] Arrival date & time: 11/18/22  1020      History   Chief Complaint Chief Complaint  Patient presents with   Dysuria    HPI Angel Haney is a 57 y.o. female.    Dysuria   Presents to urgent care with complaints of dysuria and urinary frequency.  Some suprapubic pain and pressure.  No vaginal discharge.  No fever.  Symptoms x 4 days and worsening yesterday.  Has been taking OTC supplements containing cranberry.  Past Medical History:  Diagnosis Date   Herpes zoster without mention of complication    Hordeolum externum     Patient Active Problem List   Diagnosis Date Noted   STYE 07/22/2010   Acute sinusitis, unspecified 07/22/2010   SHINGLES 01/13/2010   GASTROENTERITIS WITHOUT DEHYDRATION 01/13/2010    Past Surgical History:  Procedure Laterality Date   NO PAST SURGERIES      OB History   No obstetric history on file.      Home Medications    Prior to Admission medications   Medication Sig Start Date End Date Taking? Authorizing Provider  metroNIDAZOLE (METROGEL) 1 % gel Apply 1 application topically daily.      [provider]  phentermine 37.5 MG capsule Take 37.5 mg by mouth every morning.      [provider]    Family History History reviewed. No pertinent family history.  Social History Social History   Tobacco Use   Smoking status: Never  Substance Use Topics   Alcohol use: Yes    Comment: Occasional   Drug use: No     Allergies   Penicillins   Review of Systems Review of Systems  Genitourinary:  Positive for dysuria.     Physical Exam Triage Vital Signs ED Triage Vitals  Enc Vitals Group     BP 11/18/22 1050 (!) 165/101     Pulse Rate 11/18/22 1050 (!) 103     Resp 11/18/22 1050 16     Temp 11/18/22 1050 98.1 F (36.7 C)     Temp Source 11/18/22 1050 Oral     SpO2 11/18/22 1050 96 %     Weight --      Height --      Head Circumference --      Peak Flow  --      Pain Score 11/18/22 1043 7     Pain Loc --      Pain Edu? --      Excl. in [place]? --    No data found.  Updated Vital Signs BP (!) 165/101 (BP Location: Left Arm)   Pulse (!) 103   Temp 98.1 F (36.7 C) (Oral)   Resp 16   SpO2 96%   Visual Acuity Right Eye Distance:   Left Eye Distance:   Bilateral Distance:    Right Eye Near:   Left Eye Near:    Bilateral Near:     Physical Exam Vitals reviewed.  Constitutional:      Appearance: Normal appearance.  Skin:    General: Skin is warm and dry.  Neurological:     General: No focal deficit present.     Mental Status: She is alert and oriented to person, place, and time.  Psychiatric:        Mood and Affect: Mood normal.        Behavior: Behavior normal.      UC Treatments / Results  Labs (all labs ordered are listed, but only abnormal results are displayed) Labs Reviewed  POCT URINALYSIS DIP (MANUAL ENTRY) - Abnormal; Notable for the following components:      Result Value   Clarity, UA cloudy (*)    Spec Grav, UA >=1.030 (*)    Blood, UA large (*)    Protein Ur, POC >=300 (*)    Nitrite, UA Positive (*)    Leukocytes, UA Large (3+) (*)    All other components within normal limits    EKG   Radiology No results found.  Procedures Procedures (including critical care time)  Medications Ordered in UC Medications - No data to display  Initial Impression / Assessment and Plan / UC Course  I have reviewed the triage vital signs and the nursing notes.  Pertinent labs & imaging results that were available during my care of the patient were reviewed by me and considered in my medical decision making (see chart for details).  UA is consistent with acute cystitis with hematuria.  3+ leukocytes, nitrite positive, large blood.  Will treat with Macrobid.   Final Clinical Impressions(s) / UC Diagnoses   Final diagnoses:  None   Discharge Instructions   None    ED Prescriptions   None    PDMP not  reviewed this encounter.   Rose Phi, [place] 11/18/22 1113

## 2022-11-18 NOTE — Discharge Instructions (Addendum)
Follow up here or with your primary care provider if your symptoms are worsening or not improving with treatment.     

## 2023-03-15 ENCOUNTER — Ambulatory Visit
Admission: EM | Admit: 2023-03-15 | Discharge: 2023-03-15 | Disposition: A | Discharge status: Home / Self Care | Payer: Commercial Managed Care - PPO | Attending: Emergency Medicine | Admitting: Emergency Medicine

## 2023-03-15 DIAGNOSIS — R3915 Urgency of urination: Secondary | ICD-10-CM | POA: Diagnosis not present

## 2023-03-15 DIAGNOSIS — R350 Frequency of micturition: Secondary | ICD-10-CM

## 2023-03-15 HISTORY — DX: Prediabetes: R73.03

## 2023-03-15 HISTORY — DX: Pure hypercholesterolemia, unspecified: E78.00

## 2023-03-15 HISTORY — DX: Essential (primary) hypertension: I10

## 2023-03-15 LAB — POCT URINALYSIS DIP (MANUAL ENTRY)
Bilirubin, UA: NEGATIVE
Blood, UA: NEGATIVE
Glucose, UA: NEGATIVE mg/dL
Ketones, POC UA: NEGATIVE mg/dL
Leukocytes, UA: NEGATIVE
Nitrite, UA: NEGATIVE
Protein Ur, POC: NEGATIVE mg/dL
Spec Grav, UA: 1.02 (ref 1.010–1.025)
Urobilinogen, UA: 0.2 E.U./dL
pH, UA: 5.5 (ref 5.0–8.0)

## 2023-03-15 MED ORDER — NITROFURANTOIN MONOHYD MACRO 100 MG PO CAPS: 100.0000 mg | ORAL_CAPSULE | Freq: Two times a day (BID) | ORAL | 0 refills | Status: AC

## 2023-03-15 NOTE — ED Triage Notes (Signed)
Patient to Urgent Care with complaints of urinary frequency/ dysuria that started three days ago.  Denies any vaginal discharge. Denies any fevers.

## 2023-03-15 NOTE — Discharge Instructions (Signed)
Your urinalysis did not show any signs of infection your urine will be sent to the lab to determine exactly which bacteria is present, if any changes need to be made to your medications you will be notified  Begin use of Macrobid twice a day for 5 days to prophylactically treat infection  Vaginal swab checking for yeast and bacterial vaginosis is pending, you will be notified of positive test results and medications and at that time  You may use over-the-counter Azo to help minimize your symptoms until antibiotic removes bacteria, this medication will turn your urine orange  Increase your fluid intake through use of water  As always practice good hygiene, wiping front to back and avoidance of scented vaginal products to prevent further irritation  If symptoms continue to persist after use of medication or recur please follow-up with urgent care or your primary doctor as needed

## 2023-03-15 NOTE — ED Provider Notes (Signed)
[name]    CSN: [id number] Arrival date & time: 03/15/23  1139      History   Chief Complaint Chief Complaint  Patient presents with   Urinary Frequency    Entered by patient    HPI Angel Haney is a 57 y.o. female.   Patient presents for evaluation of urinary frequency, urgency and dysuria beginning 3 days ago.  Has attempted use of Uristat which has been somewhat helpful but did not resolve symptoms denies hematuria, lower abdominal pain or pressure, flank pain, fevers, vaginal symptoms. No concern for STI  Past Medical History:  Diagnosis Date   Herpes zoster without mention of complication    High cholesterol    Hordeolum externum    Hypertension    Pre-diabetes     Patient Active Problem List   Diagnosis Date Noted   STYE 07/22/2010   Acute sinusitis, unspecified 07/22/2010   SHINGLES 01/13/2010   GASTROENTERITIS WITHOUT DEHYDRATION 01/13/2010    Past Surgical History:  Procedure Laterality Date   NO PAST SURGERIES      OB History   No obstetric history on file.      Home Medications    Prior to Admission medications   Medication Sig Start Date End Date Taking? Authorizing Provider  atorvastatin (LIPITOR) 20 MG tablet Take by mouth. 01/17/23 01/17/24 Yes [provider]  metFORMIN (GLUCOPHAGE-XR) 500 MG 24 hr tablet Take by mouth. 01/20/23  Yes [provider]  nitrofurantoin, macrocrystal-monohydrate, (MACROBID) 100 MG capsule Take 1 capsule (100 mg total) by mouth 2 (two) times daily. 03/15/23  Yes Guinn Delarosa R, NP  lisinopril (ZESTRIL) 10 MG tablet Take 10 mg by mouth daily.    [provider]  metroNIDAZOLE (METROGEL) 1 % gel Apply 1 application topically daily.   Patient not taking: Reported on 03/15/2023    [provider]  phentermine 37.5 MG capsule Take 37.5 mg by mouth every morning.      [provider]    Family History No family history on file.  Social History Social History    Tobacco Use   Smoking status: Never  Substance Use Topics   Alcohol use: Yes    Comment: Occasional   Drug use: No     Allergies   Penicillins   Review of Systems Review of Systems  Genitourinary:  Positive for frequency.     Physical Exam Triage Vital Signs ED Triage Vitals  Enc Vitals Group     BP 03/15/23 1151 (!) 143/83     Pulse Rate 03/15/23 1153 86     Resp 03/15/23 1151 18     Temp 03/15/23 1151 98 F (36.7 C)     Temp src --      SpO2 03/15/23 1151 98 %     Weight --      Height --      Head Circumference --      Peak Flow --      Pain Score 03/15/23 1149 6     Pain Loc --      Pain Edu? --      Excl. in [place]? --    No data found.  Updated Vital Signs BP (!) 143/83   Pulse 86   Temp 98 F (36.7 C)   Resp 18   SpO2 98%   Visual Acuity Right Eye Distance:   Left Eye Distance:   Bilateral Distance:    Right Eye Near:   Left Eye Near:  Bilateral Near:     Physical Exam Constitutional:      Appearance: Normal appearance.  Eyes:     Extraocular Movements: Extraocular movements intact.  Pulmonary:     Effort: Pulmonary effort is normal.  Neurological:     Mental Status: She is alert and oriented to person, place, and time. Mental status is at baseline.      UC Treatments / Results  Labs (all labs ordered are listed, but only abnormal results are displayed) Labs Reviewed  URINE CULTURE  POCT URINALYSIS DIP (MANUAL ENTRY)  CERVICOVAGINAL ANCILLARY ONLY    EKG   Radiology No results found.  Procedures Procedures (including critical care time)  Medications Ordered in UC Medications - No data to display  Initial Impression / Assessment and Plan / UC Course  I have reviewed the triage vital signs and the nursing notes.  Pertinent labs & imaging results that were available during my care of the patient were reviewed by me and considered in my medical decision making (see chart for details).  Urinary frequency, urinary  urgency  Urinalysis negative this patient is symptomatic sent for culture, vaginal swab check for BV and yeast pending, prophylactically treating for urinary infection has history of recurrent, Macrobid sent to pharmacy and discussed additional supportive measures, may follow-up with urgent care as needed Final Clinical Impressions(s) / UC Diagnoses   Final diagnoses:  Urinary frequency  Urinary urgency     Discharge Instructions      Your urinalysis did not show any signs of infection your urine will be sent to the lab to determine exactly which bacteria is present, if any changes need to be made to your medications you will be notified  Begin use of Macrobid twice a day for 5 days to prophylactically treat infection  Vaginal swab checking for yeast and bacterial vaginosis is pending, you will be notified of positive test results and medications and at that time  You may use over-the-counter Azo to help minimize your symptoms until antibiotic removes bacteria, this medication will turn your urine orange  Increase your fluid intake through use of water  As always practice good hygiene, wiping front to back and avoidance of scented vaginal products to prevent further irritation  If symptoms continue to persist after use of medication or recur please follow-up with urgent care or your primary doctor as needed    ED Prescriptions     Medication Sig Dispense Auth. Provider   nitrofurantoin, macrocrystal-monohydrate, (MACROBID) 100 MG capsule Take 1 capsule (100 mg total) by mouth 2 (two) times daily. 10 capsule Valinda Hoar, NP      PDMP not reviewed this encounter.   Valinda Hoar, NP 03/15/23 1236

## 2023-03-16 LAB — CERVICOVAGINAL ANCILLARY ONLY
Bacterial Vaginitis (gardnerella): POSITIVE — AB
Candida Glabrata: NEGATIVE
Candida Vaginitis: NEGATIVE
Comment: NEGATIVE
Comment: NEGATIVE
Comment: NEGATIVE

## 2023-03-16 LAB — URINE CULTURE: Culture: <10000 — AB

## 2023-03-17 ENCOUNTER — Telehealth (HOSPITAL_COMMUNITY): Payer: Self-pay | Admitting: Emergency Medicine

## 2023-03-17 MED ORDER — METRONIDAZOLE 0.75 % VA GEL: 1.0000 | Freq: Every day | VAGINAL | 0 refills | Status: AC
